# Patient Record
Sex: Male | Born: 1950 | Race: Black or African American | Hispanic: No | Marital: Married | Smoking: Current every day smoker
Health system: Southern US, Community
[De-identification: ages and names within clinical notes are randomized; demographics above are authoritative.]

## PROBLEM LIST (undated history)

## (undated) HISTORY — PX: HERNIA REPAIR: SHX51

---

## 2006-10-15 ENCOUNTER — Ambulatory Visit: Payer: Self-pay | Admitting: Gastroenterology

## 2006-10-23 ENCOUNTER — Ambulatory Visit: Payer: Self-pay | Admitting: Gastroenterology

## 2006-10-23 ENCOUNTER — Encounter (INDEPENDENT_AMBULATORY_CARE_PROVIDER_SITE_OTHER): Payer: Self-pay | Admitting: Specialist

## 2009-09-25 ENCOUNTER — Encounter (INDEPENDENT_AMBULATORY_CARE_PROVIDER_SITE_OTHER): Payer: Self-pay | Admitting: *Deleted

## 2010-07-09 ENCOUNTER — Telehealth: Payer: Self-pay | Admitting: Gastroenterology

## 2010-07-10 ENCOUNTER — Encounter
Admission: RE | Admit: 2010-07-10 | Discharge: 2010-07-10 | Payer: Self-pay | Source: Home / Self Care | Attending: Internal Medicine | Admitting: Internal Medicine

## 2010-07-23 ENCOUNTER — Encounter
Admission: RE | Admit: 2010-07-23 | Discharge: 2010-07-23 | Payer: Self-pay | Source: Home / Self Care | Attending: Internal Medicine | Admitting: Internal Medicine

## 2010-07-23 ENCOUNTER — Other Ambulatory Visit: Payer: Self-pay | Admitting: Interventional Radiology

## 2010-07-23 ENCOUNTER — Other Ambulatory Visit
Admission: RE | Admit: 2010-07-23 | Discharge: 2010-07-23 | Payer: Self-pay | Source: Home / Self Care | Admitting: Interventional Radiology

## 2010-08-08 NOTE — Progress Notes (Signed)
Summary: Schedule Colonoscopy   Phone Note Outgoing Call Call back at Home Phone [phone]   Call placed by: Harlow Mares CMA Duncan Dull),  July 09, 2010 12:21 PM Call placed to: Patient Summary of Call: patient is due for colonoscopy, i attempted to contact the patient and his number just stops ringing.  Initial call taken by: Harlow Mares CMA Duncan Dull),  July 09, 2010 12:22 PM  Follow-up for Phone Call        patients numbers are not working we will mail a letter to remind the patient of his recall  Follow-up by: Harlow Mares CMA (AAMA),  July 19, 2010 1:51 PM

## 2010-08-08 NOTE — Letter (Signed)
Summary: Colonoscopy Letter  [facility]  [address] [place], [place] [address]   Phone: [phone]  Fax: [phone]      September 25, 2009 MRN: [id number]   [facility] [address] [place], [place]  [address]   Dear Mr. Easterday,   According to your medical record, it is time for you to schedule a Colonoscopy. The American Cancer Society recommends this procedure as a method to detect early colon cancer. Patients with a family history of colon cancer, or a personal history of colon polyps or inflammatory bowel disease are at increased risk.  This letter has been generated based on the recommendations made at the time of your procedure. If you feel that in your particular situation this may no longer apply, please contact our office.  Please call our office at [phone] to schedule this appointment or to update your records at your earliest convenience.  Thank you for cooperating with [place] to provide you with the very best care possible.   Sincerely,  Barbette Hair. Arlyce Dice, M.D.  [facility] [phone]

## 2010-09-16 ENCOUNTER — Ambulatory Visit (HOSPITAL_COMMUNITY): Admission: RE | Admit: 2010-09-16 | Payer: 59 | Source: Ambulatory Visit | Admitting: General Surgery

## 2010-09-26 ENCOUNTER — Other Ambulatory Visit (HOSPITAL_COMMUNITY): Payer: 59

## 2010-09-27 ENCOUNTER — Other Ambulatory Visit (HOSPITAL_COMMUNITY): Payer: 59

## 2010-10-09 ENCOUNTER — Ambulatory Visit (HOSPITAL_COMMUNITY): Admission: RE | Admit: 2010-10-09 | Payer: 59 | Source: Ambulatory Visit | Admitting: General Surgery

## 2011-07-06 ENCOUNTER — Encounter (INDEPENDENT_AMBULATORY_CARE_PROVIDER_SITE_OTHER): Payer: 59

## 2011-07-06 DIAGNOSIS — Z0000 Encounter for general adult medical examination without abnormal findings: Secondary | ICD-10-CM

## 2011-07-06 DIAGNOSIS — E041 Nontoxic single thyroid nodule: Secondary | ICD-10-CM

## 2011-10-09 ENCOUNTER — Encounter: Payer: Self-pay | Admitting: Gastroenterology

## 2017-05-20 ENCOUNTER — Other Ambulatory Visit: Payer: Self-pay

## 2017-05-20 ENCOUNTER — Emergency Department (HOSPITAL_COMMUNITY)
Admission: EM | Admit: 2017-05-20 | Discharge: 2017-05-20 | Disposition: A | Discharge status: Home / Self Care | Payer: 59 | Attending: Emergency Medicine | Admitting: Emergency Medicine

## 2017-05-20 ENCOUNTER — Emergency Department (HOSPITAL_COMMUNITY): Payer: 59

## 2017-05-20 ENCOUNTER — Encounter (HOSPITAL_COMMUNITY): Payer: Self-pay | Admitting: *Deleted

## 2017-05-20 DIAGNOSIS — S72114A Nondisplaced fracture of greater trochanter of right femur, initial encounter for closed fracture: Secondary | ICD-10-CM | POA: Diagnosis not present

## 2017-05-20 DIAGNOSIS — Y929 Unspecified place or not applicable: Secondary | ICD-10-CM | POA: Insufficient documentation

## 2017-05-20 DIAGNOSIS — Y999 Unspecified external cause status: Secondary | ICD-10-CM | POA: Insufficient documentation

## 2017-05-20 DIAGNOSIS — Y9389 Activity, other specified: Secondary | ICD-10-CM | POA: Insufficient documentation

## 2017-05-20 DIAGNOSIS — W01198A Fall on same level from slipping, tripping and stumbling with subsequent striking against other object, initial encounter: Secondary | ICD-10-CM | POA: Diagnosis not present

## 2017-05-20 DIAGNOSIS — F17210 Nicotine dependence, cigarettes, uncomplicated: Secondary | ICD-10-CM | POA: Diagnosis not present

## 2017-05-20 DIAGNOSIS — S79911A Unspecified injury of right hip, initial encounter: Secondary | ICD-10-CM | POA: Diagnosis present

## 2017-05-20 MED ORDER — FENTANYL CITRATE (PF) 100 MCG/2ML IJ SOLN: 50.0000 ug | Freq: Once | INTRAMUSCULAR | Status: DC

## 2017-05-20 MED ORDER — ACETAMINOPHEN 325 MG PO TABS
650.0000 mg | ORAL_TABLET | Freq: Once | ORAL | Status: AC
  Administered 2017-05-20: 650 mg via ORAL
  Filled 2017-05-20: qty 2

## 2017-05-20 MED ORDER — HYDROCODONE-ACETAMINOPHEN 5-325 MG PO TABS: 1.0000 | ORAL_TABLET | Freq: Four times a day (QID) | ORAL | 0 refills | Status: DC | PRN

## 2017-05-20 NOTE — ED Notes (Signed)
Patient transported to CT 

## 2017-05-20 NOTE — ED Triage Notes (Signed)
Pt states he slipped on water yesterday and "pancaked" on his R side.  States unable to bear weight to R foot since then.

## 2017-05-20 NOTE — Discharge Instructions (Signed)
Norco and motrin as needed for pain. Ice affected area (see instructions below).  Use the walker for toe-touch weightbearing as tolerated. Please call the orthopedic physician listed today or first thing in the morning to schedule a follow up appointment.   Fractures generally take 4-6 weeks to heal. It is very important to keep your splint dry until your follow up with the orthopedic doctor and a cast can be applied. You may place a plastic bag around the extremity with the splint while bathing to keep it dry. Also try to sleep with the extremity elevated for the next several nights to decrease swelling. Check the fingertips and toes several times per day to make sure they are not cold, pale, or blue. If this is the case, the splint may be too tight and should return to the ER, your regular doctor or the orthopedist for recheck. Return to the ER for new or worsening symptoms, any additional concerns.   COLD THERAPY DIRECTIONS:  Ice or gel packs can be used to reduce both pain and swelling. Ice is the most helpful within the first 24 to 48 hours after an injury or flareup from overusing a muscle or joint.  Ice is effective, has very few side effects, and is safe for most people to use.   If you expose your skin to cold temperatures for too long or without the proper protection, you can damage your skin or nerves. Watch for signs of skin damage due to cold.   HOME CARE INSTRUCTIONS  Follow these tips to use ice and cold packs safely.  Place a dry or damp towel between the ice and skin. A damp towel will cool the skin more quickly, so you may need to shorten the time that the ice is used.  For a more rapid response, add gentle compression to the ice.  Ice for no more than 10 to 20 minutes at a time. The bonier the area you are icing, the less time it will take to get the benefits of ice.  Check your skin after 5 minutes to make sure there are no signs of a poor response to cold or skin damage.  Rest 20  minutes or more in between uses.  Once your skin is numb, you can end your treatment. You can test numbness by very lightly touching your skin. The touch should be so light that you do not see the skin dimple from the pressure of your fingertip. When using ice, most people will feel these normal sensations in this order: cold, burning, aching, and numbness.

## 2017-05-20 NOTE — Discharge Planning (Signed)
Nicholas Cohnamellia Martin Belling, RN, BSN, [place] [phone] Pt qualifies for DME rolling walker.  DME  ordered through [facility].  [facility] notified to deliver rolling walker to pt room prior to D/C home.

## 2017-05-20 NOTE — ED Provider Notes (Signed)
[facility] EMERGENCY DEPARTMENT Provider Note   CSN: [id number] Arrival date & time: 05/20/17  0848     History   Chief Complaint Chief Complaint  Patient presents with  . Hip Pain    HPI Nicholas Montes is a 66 y.o. male.  HPI 66 year old African-American male with no pertinent past medical history presents to the ED with complaints of right hip pain.  Patient states that yesterday he slipped on water and fell onto his right hip.  Patient states that he is able to bear weight on the right leg since the incident.  Patient states that he did continue to work after he fell yesterday but last night the pain worsen.  Patient denies any associated edema, paresthesias, weakness.  Patient denies any associated wound injury or LOC.  Patient has not taking for the pain prior to arrival.  Ambulation, range of motion and palpation make the pain worse.  Holding still and sitting makes the pain better. History reviewed. No pertinent past medical history.  There are no active problems to display for this patient.   Past Surgical History:  Procedure Laterality Date  . HERNIA REPAIR         Home Medications    Prior to Admission medications   Not on File    Family History No family history on file.  Social History Social History   Tobacco Use  . Smoking status: Current Every Day Smoker    Packs/day: 0.50    Types: Cigarettes  . Smokeless tobacco: Never Used  Substance Use Topics  . Alcohol use: No    Frequency: Never  . Drug use: No     Allergies   Patient has no known allergies.   Review of Systems Review of Systems  Musculoskeletal: Positive for arthralgias, gait problem and joint swelling. Negative for back pain, myalgias, neck pain and neck stiffness.  Skin: Negative for color change and wound.  Neurological: Negative for syncope, weakness, numbness and headaches.     Physical Exam Updated Vital Signs BP (!) 134/96   Pulse 65   Temp  98.6 F (37 C) (Oral)   Resp 18   Ht 5\' 11"  (1.803 m)   Wt 64.9 kg (143 lb)   SpO2 96%   BMI 19.94 kg/m   Physical Exam  Constitutional: He appears well-developed and well-nourished. No distress.  HENT:  Head: Normocephalic and atraumatic.  Eyes: Right eye exhibits no discharge. Left eye exhibits no discharge. No scleral icterus.  Neck: Normal range of motion.  Cardiovascular: Intact distal pulses.  Pulmonary/Chest: No respiratory distress.  Musculoskeletal: Normal range of motion.  Patient with palpation over the right SI joint.  No obvious deformity, ecchymosis, edema.  Patient has limited active range of motion with flexion of the right hip.  He does have pain with passive flexion of the hip and external and internal rotation.  Patient has no shortening or external rotation of the right lower extremity.  DP pulses are 2+ bilaterally.  Sensation intact.  Full range of motion of the right knee without any pain.  Full range of motion of the right ankle without any pain.  Brisk cap refill.  Strength 5 out of 5 in lower extremities.  Patient has no midline L-spine or T-spine tenderness.  No deformity or step-offs noted.  No paraspinal tenderness.  Pelvis is stable.  Patient has pain with significant pressure of the right extremity.  Neurological: He is alert.  Skin: Skin is warm and dry.  Capillary refill takes less than 2 seconds. No pallor.  Psychiatric: His behavior is normal. Judgment and thought content normal.  Nursing note and vitals reviewed.    ED Treatments / Results  Labs (all labs ordered are listed, but only abnormal results are displayed) Labs Reviewed - No data to display  EKG  EKG Interpretation None       Radiology Ct Pelvis Wo Contrast  Result Date: 05/20/2017 CLINICAL DATA:  Right hip pain and limited range of motion after a fall yesterday. EXAM: CT PELVIS WITHOUT CONTRAST TECHNIQUE: Multidetector CT imaging of the pelvis was performed following the  standard protocol without intravenous contrast. COMPARISON:  Radiographs dated 05/20/2017 FINDINGS: Musculoskeletal: There is a hairline longitudinal fracture of the right greater trochanter with no displacement. No other acute abnormality. Chronic fusion of the sacroiliac joints. Minimal marginal osteophytes on both femoral heads. Urinary Tract:  No abnormality visualized. Bowel:  Unremarkable visualized pelvic bowel loops. Vascular/Lymphatic: Aortic atherosclerosis.  No adenopathy. Reproductive:  Prostate gland is normal. Other:  None IMPRESSION: Hairline nondisplaced fracture of the right greater trochanter. Electronically Signed   By: Francene BoyersJames  Maxwell M.D.   On: 05/20/2017 12:21   Dg Hip Unilat  With Pelvis 2-3 Views Right  Result Date: 05/20/2017 CLINICAL DATA:  Slip and fall with pain, initial encounter EXAM: DG HIP (WITH OR WITHOUT PELVIS) 2-3V RIGHT COMPARISON:  None. FINDINGS: The pelvic ring is intact. No acute fracture or dislocation is noted. No soft tissue changes are seen. IMPRESSION: No acute abnormality noted. Electronically Signed   By: Alcide CleverMark  Lukens M.D.   On: 05/20/2017 09:45    Procedures Procedures (including critical care time)  Medications Ordered in ED Medications  fentaNYL (SUBLIMAZE) injection 50 mcg (not administered)  acetaminophen (TYLENOL) tablet 650 mg (650 mg Oral Given 05/20/17 1116)     Initial Impression / Assessment and Plan / ED Course  I have reviewed the triage vital signs and the nursing notes.  Pertinent labs & imaging results that were available during my care of the patient were reviewed by me and considered in my medical decision making (see chart for details).     Patient presents to the ED with complaints of right hip pain.  Patient fell yesterday after mechanical fall.  Denies head injury or LOC.  On exam patient is neurovascularly intact.  Limited range of motion due to pain.  No obvious deformity, ecchymosis noted.  X-ray of the right hip was  unremarkable.  CT scan was obtained given patient's pain with weightbearing of the right leg.  CT scan does show a hairline fracture of the greater trochanter.  Patient remains neurovascularly intact.  Pain is been managed in the ED.  Patient denies needing any further pain management at this time.  Spoke with Dr. [facility]  He recommends patient having a walker with toe-touch and pain medicine.  Will see patient in the clinic in 1 week.  Pt is hemodynamically stable, in NAD, & able to ambulate in the ED. Evaluation does not show pathology that would require ongoing emergent intervention or inpatient treatment. I explained the diagnosis to the patient. Pain has been managed & has no complaints prior to dc. Pt is comfortable with above plan and is stable for discharge at this time. All questions were answered prior to disposition. Strict return precautions for f/u to the ED were discussed. Encouraged follow up with PCP.  Pt dicussed and seen by Dr. Adriana Simasook who is agreeable with the  above plan.  Final Clinical Impressions(s) / ED Diagnoses   Final diagnoses:  Nondisplaced fracture of greater trochanter of right femur, initial encounter for closed fracture [facility](HCC)    ED Discharge Orders        Ordered    HYDROcodone-acetaminophen (NORCO) 5-325 MG tablet  Every 6 hours PRN     05/20/17 1315       Wallace KellerLeaphart, Anajulia Leyendecker T, PA-C 05/20/17 1316    Donnetta Hutchingook, Brian, MD 05/26/17 1024

## 2017-05-20 NOTE — Progress Notes (Signed)
CSW acknowledges consult. Please consult RN CM for equipment needs. There are no further CSW needs at this time. CSW signing goff.     Claude MangesKierra S. Cartez Mogle, MSW, LCSW-A Emergency Department Clinical Social Worker [phone]

## 2018-05-08 IMAGING — DX DG HIP (WITH OR WITHOUT PELVIS) 2-3V*R*
3 series · 3 of 3 positions shown · non-contrast
Comparison: None.

CLINICAL DATA: Slip and fall with pain, initial encounter

EXAM:
DG HIP (WITH OR WITHOUT PELVIS) 2-3V RIGHT

[pelvis ap]
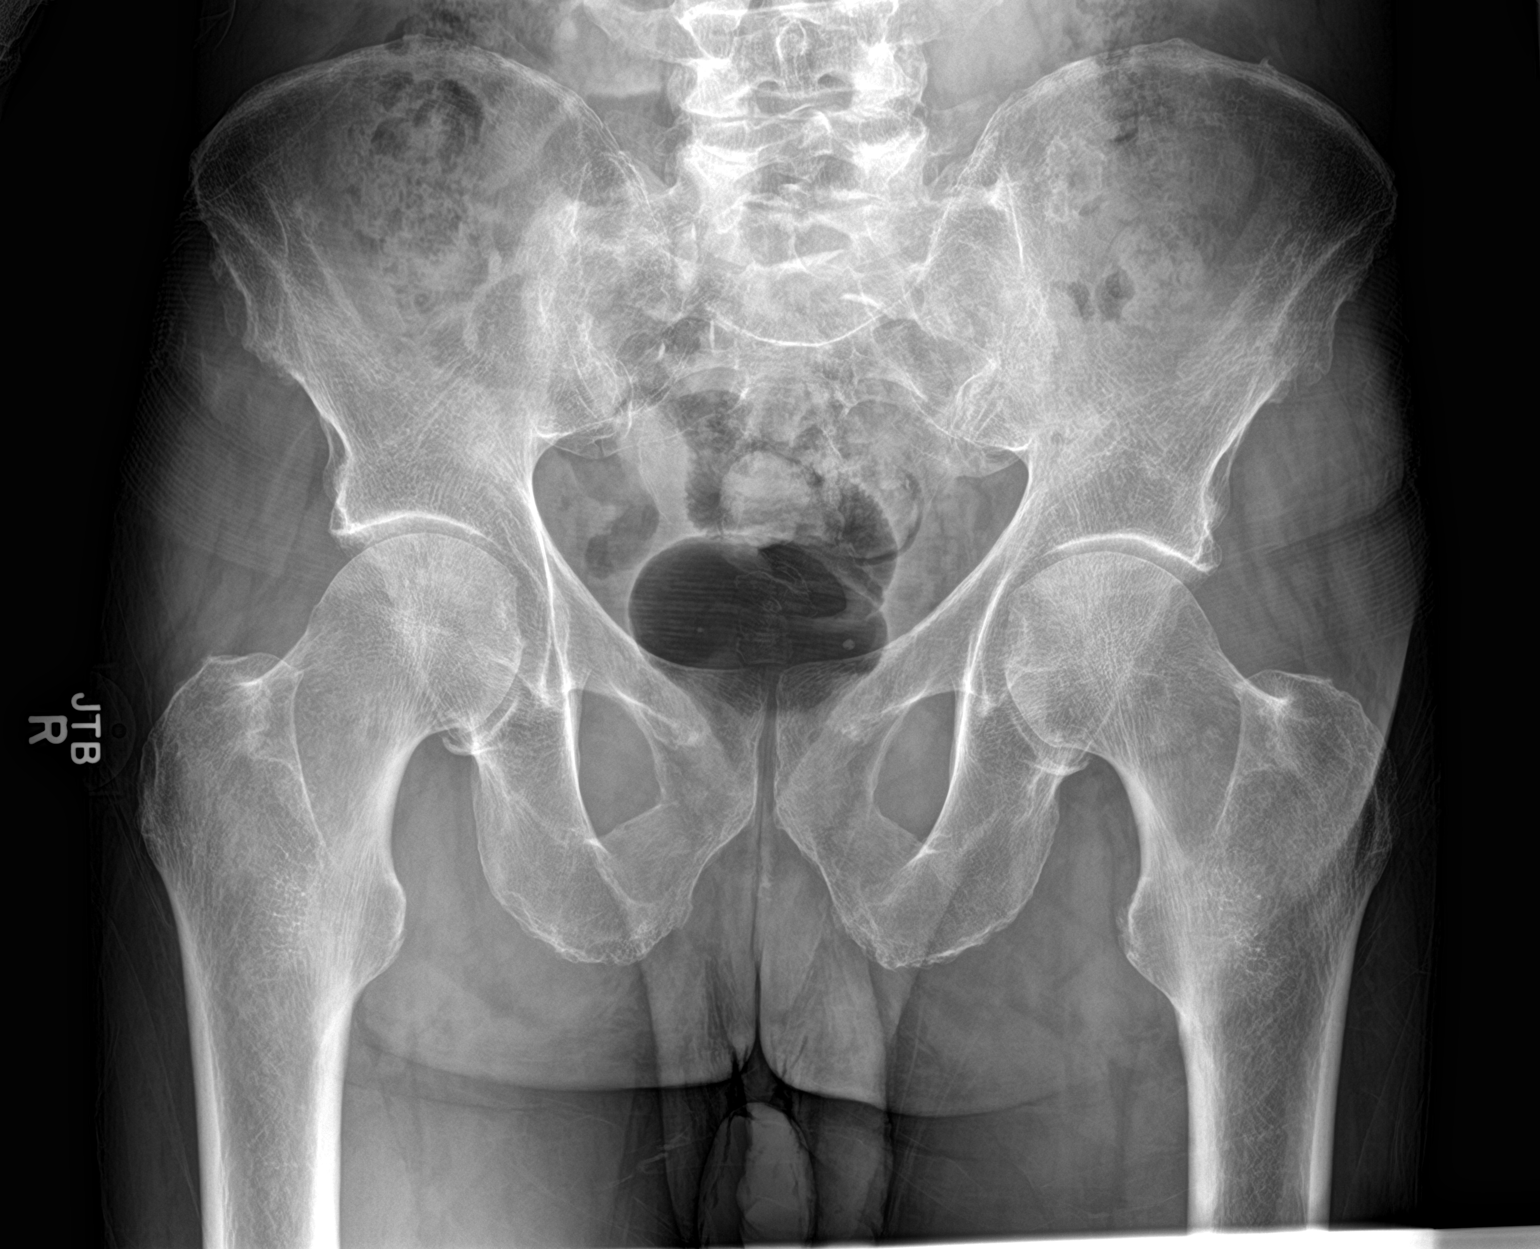

[hip ap]
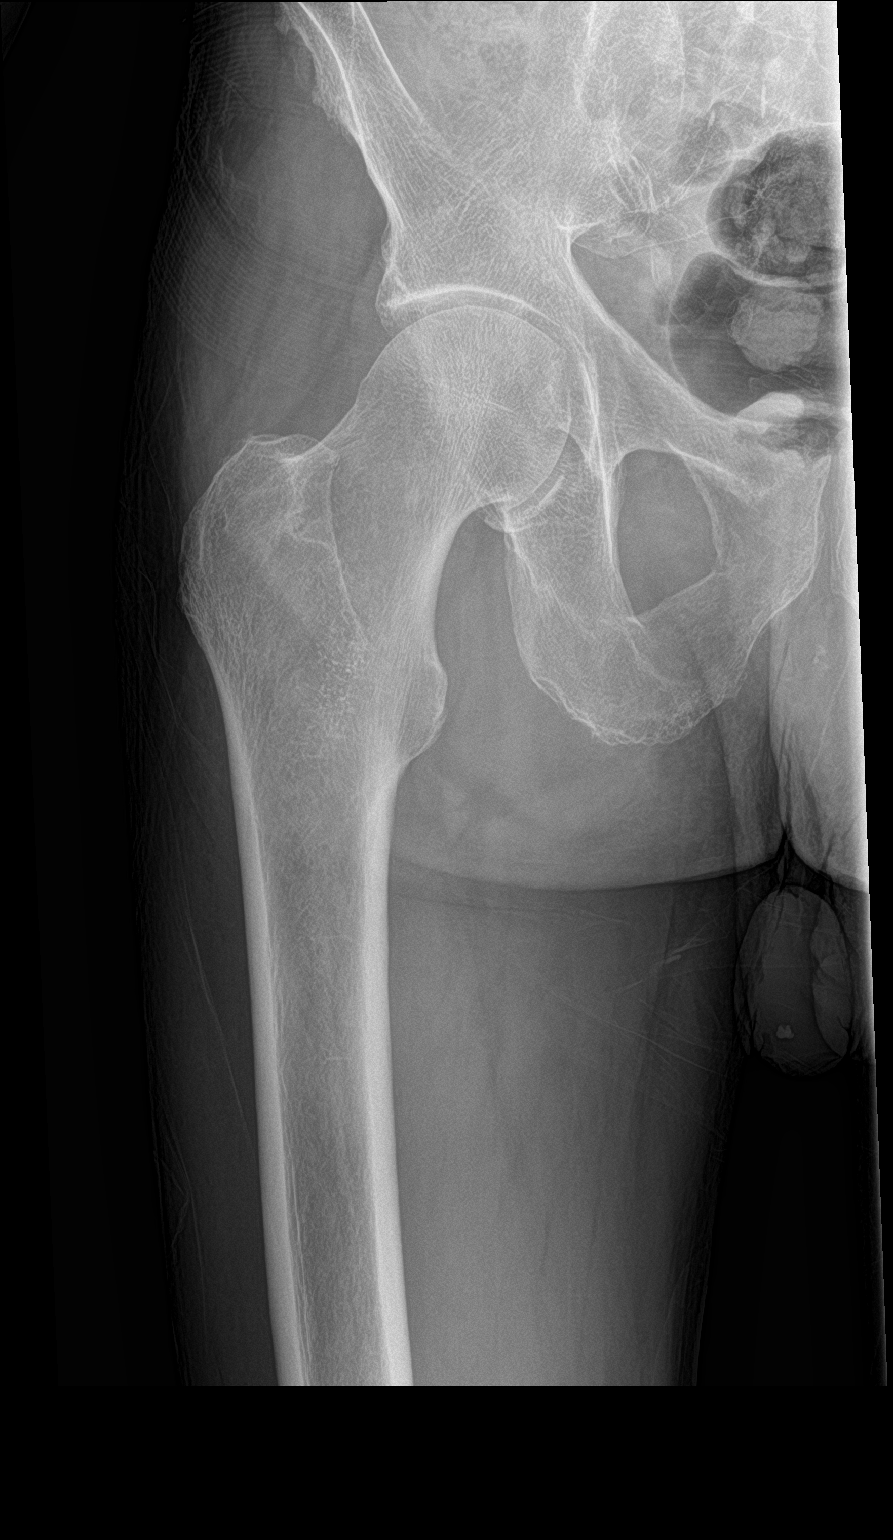

[hip lat]
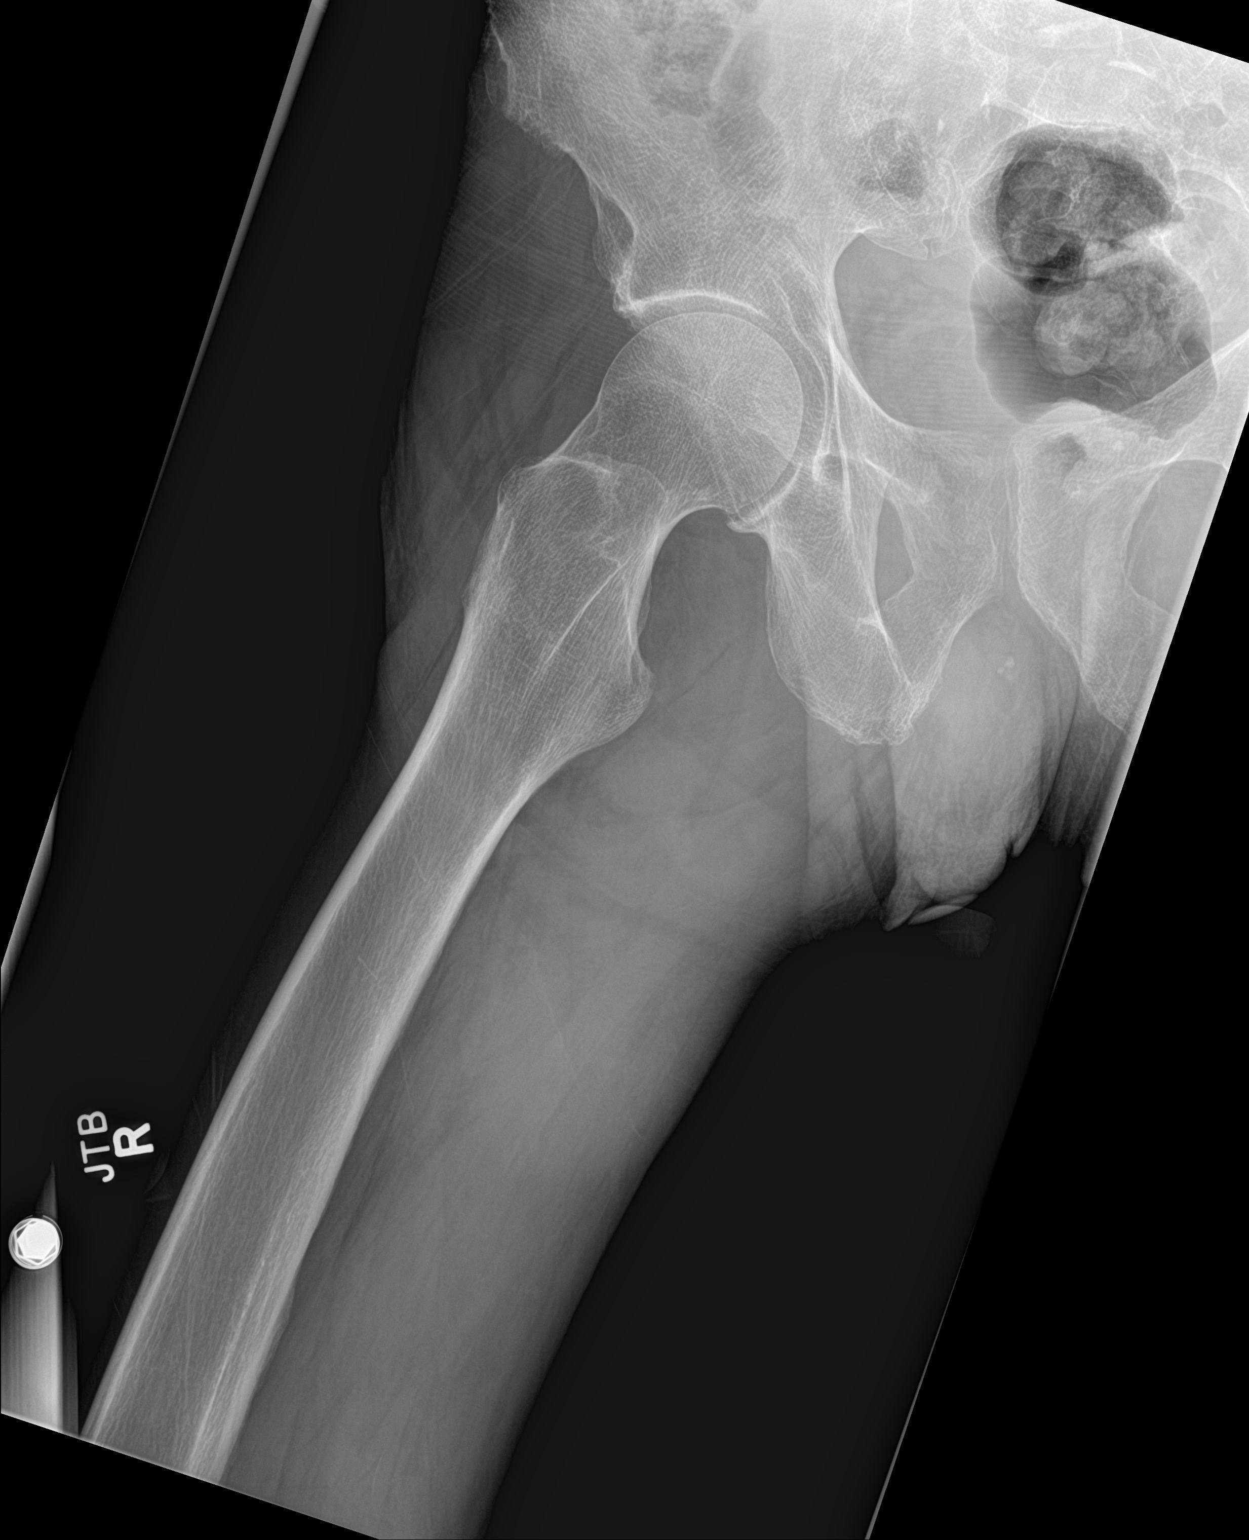

[3 of 3 positions shown; findings below may reference images not displayed]

FINDINGS: The pelvic ring is intact. No acute fracture or dislocation is
noted. No soft tissue changes are seen.
IMPRESSION: No acute abnormality noted.

## 2018-05-08 IMAGING — CT CT PELVIS W/O CM
1 series · 1 of 1 positions shown · non-contrast
Comparison: Radiographs dated 05/20/2017

CLINICAL DATA: Right hip pain and limited range of motion after a
fall yesterday.

EXAM:
CT PELVIS WITHOUT CONTRAST
TECHNIQUE: Multidetector CT imaging of the pelvis was performed following the
standard protocol without intravenous contrast.

[Series 2: topogram 0.6 t80f · sagittal · 1.00mm/px · 1 of 1 slices shown]
[im 1/1]
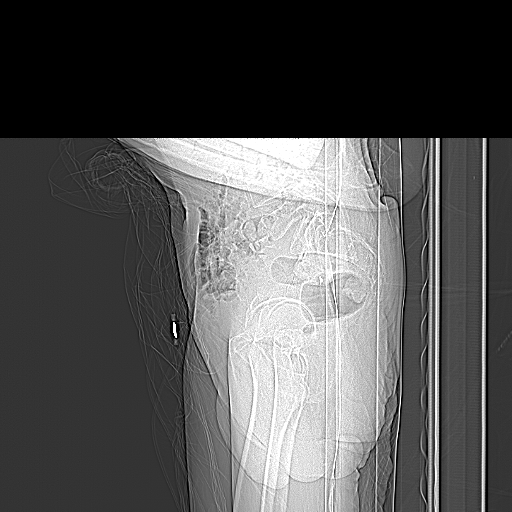

[1 of 1 positions shown; findings below may reference images not displayed]

FINDINGS: Musculoskeletal: There is a hairline longitudinal fracture of the
right greater trochanter with no displacement. No other acute
abnormality. Chronic fusion of the sacroiliac joints.

Minimal marginal osteophytes on both femoral heads.

Urinary Tract:  No abnormality visualized.

Bowel:  Unremarkable visualized pelvic bowel loops.

Vascular/Lymphatic: Aortic atherosclerosis.  No adenopathy.

Reproductive:  Prostate gland is normal.

Other:  None
IMPRESSION: Hairline nondisplaced fracture of the right greater trochanter.

## 2019-07-21 ENCOUNTER — Other Ambulatory Visit: Payer: Self-pay | Admitting: Cardiology

## 2019-07-21 DIAGNOSIS — Z20822 Contact with and (suspected) exposure to covid-19: Secondary | ICD-10-CM

## 2019-07-22 LAB — NOVEL CORONAVIRUS, NAA: SARS-CoV-2, NAA: DETECTED — AB

## 2023-08-25 ENCOUNTER — Encounter (HOSPITAL_COMMUNITY): Payer: Self-pay

## 2023-08-25 ENCOUNTER — Ambulatory Visit (HOSPITAL_COMMUNITY)
Admission: EM | Admit: 2023-08-25 | Discharge: 2023-08-25 | Disposition: A | Discharge status: Home / Self Care | Payer: Medicare Other | Attending: Emergency Medicine | Admitting: Emergency Medicine

## 2023-08-25 DIAGNOSIS — K5900 Constipation, unspecified: Secondary | ICD-10-CM

## 2023-08-25 DIAGNOSIS — K648 Other hemorrhoids: Secondary | ICD-10-CM | POA: Diagnosis not present

## 2023-08-25 MED ORDER — PLECANATIDE 3 MG PO TABS: 1.0000 | ORAL_TABLET | Freq: Every day | ORAL | 0 refills | Status: AC

## 2023-08-25 MED ORDER — HYDROCORTISONE (PERIANAL) 2.5 % EX CREA: 1.0000 | TOPICAL_CREAM | Freq: Two times a day (BID) | CUTANEOUS | 0 refills | Status: AC

## 2023-08-25 NOTE — ED Triage Notes (Signed)
 Pt c/o constipation x2 days d/t the pain from his hemorrhoid. States painful to sit.

## 2023-08-25 NOTE — Discharge Instructions (Signed)
 For relief of chronic constipation, please begin plecanatide tablets, 1 tablet daily as needed to move your bowels.  For relief of internal hemorrhoids, please apply hydrocortisone rectal cream twice daily for 7 days.  After 7 days, if you have not had meaningful improvement of your symptoms, please follow-up with your primary care provider.  Thank you for visiting [facility] today.

## 2023-08-25 NOTE — ED Provider Notes (Signed)
 [facility]    CSN: [id number] Arrival date & time: 08/25/23  1140    HISTORY   Chief Complaint  Patient presents with   Hemorrhoids   Constipation   HPI Nicholas Montes is a pleasant, 73 y.o. male who presents to urgent care today. Patient reports history of constant constipation, worse over the past 2 days.  Patient states he believes that he has developed a hemorrhoid but states the hemorrhoid is not sticking out.  Patient states it is painful to sit down.  Patient denies rectal bleeding.  Patient states has never had hemorrhoids before.  The history is provided by the patient.   History reviewed. No pertinent past medical history. There are no active problems to display for this patient.  Past Surgical History:  Procedure Laterality Date   HERNIA REPAIR      Home Medications    Prior to Admission medications   Not on File    Family History History reviewed. No pertinent family history. Social History Social History   Tobacco Use   Smoking status: Every Day    Current packs/day: 0.50    Types: Cigarettes   Smokeless tobacco: Never  Vaping Use   Vaping status: Never Used  Substance Use Topics   Alcohol use: No   Drug use: No   Allergies   Patient has no known allergies.  Review of Systems Review of Systems Pertinent findings revealed after performing a 14 point review of systems has been noted in the history of present illness.  Physical Exam Vital Signs BP (!) 188/105 (BP Location: Right Arm)   Pulse 90   Temp 98.3 F (36.8 C) (Oral)   Resp 20   SpO2 97%   No data found.  Physical Exam Vitals and nursing note reviewed.  Constitutional:      General: He is not in acute distress.    Appearance: Normal appearance. He is normal weight. He is not ill-appearing.  HENT:     Head: Normocephalic and atraumatic.  Eyes:     Extraocular Movements: Extraocular movements intact.     Conjunctiva/sclera: Conjunctivae normal.     Pupils:  Pupils are equal, round, and reactive to light.  Cardiovascular:     Rate and Rhythm: Normal rate and regular rhythm.  Pulmonary:     Effort: Pulmonary effort is normal.     Breath sounds: Normal breath sounds.  Genitourinary:    Comments: Pt politely declined rectal examination Musculoskeletal:        General: Normal range of motion.     Cervical back: Normal range of motion and neck supple.  Skin:    General: Skin is warm and dry.  Neurological:     General: No focal deficit present.     Mental Status: He is alert and oriented to person, place, and time. Mental status is at baseline.  Psychiatric:        Mood and Affect: Mood normal.        Behavior: Behavior normal.        Thought Content: Thought content normal.        Judgment: Judgment normal.     Visual Acuity Right Eye Distance:   Left Eye Distance:   Bilateral Distance:    Right Eye Near:   Left Eye Near:    Bilateral Near:     UC Couse / Diagnostics / Procedures:     Radiology No results found.  Procedures Procedures (including critical care time) EKG  Pending results:  Labs Reviewed - No data to display  Medications Ordered in UC: Medications - No data to display  UC Diagnoses / Final Clinical Impressions(s)   I have reviewed the triage vital signs and the nursing notes.  Pertinent labs & imaging results that were available during my care of the patient were reviewed by me and considered in my medical decision making (see chart for details).    Final diagnoses:  Constipation, unspecified constipation type  Internal hemorrhoids   Patient provided with hydrocortisone rectal cream for relief of presumed internal hemorrhoids.  Have provided patient with Trulance for relief of constipation.  Follow-up with PCP as needed.  Please see discharge instructions below for details of plan of care as provided to patient. ED Prescriptions     Medication Sig Dispense Auth. Provider   hydrocortisone  (ANUSOL-HC) 2.5 % rectal cream Place 1 Application rectally 2 (two) times daily. Discontinue after 1 week 30 g Theadora Rama Scales, PA-C   Plecanatide 3 MG TABS Take 1 tablet (3 mg total) by mouth daily. 30 tablet Theadora Rama Scales, PA-C      PDMP not reviewed this encounter.  Pending results:  Labs Reviewed - No data to display    Discharge Instructions      For relief of chronic constipation, please begin plecanatide tablets, 1 tablet daily as needed to move your bowels.  For relief of internal hemorrhoids, please apply hydrocortisone rectal cream twice daily for 7 days.  After 7 days, if you have not had meaningful improvement of your symptoms, please follow-up with your primary care provider.  Thank you for visiting [facility] today.      Disposition Upon Discharge:  Condition: stable for discharge home  Patient presented with an acute illness with associated systemic symptoms and significant discomfort requiring urgent management. In my opinion, this is a condition that a prudent lay person (someone who possesses an average knowledge of health and medicine) may potentially expect to result in complications if not addressed urgently such as respiratory distress, impairment of bodily function or dysfunction of bodily organs.   Routine symptom specific, illness specific and/or disease specific instructions were discussed with the patient and/or caregiver at length.   As such, the patient has been evaluated and assessed, work-up was performed and treatment was provided in alignment with urgent care protocols and evidence based medicine.  Patient/parent/caregiver has been advised that the patient may require follow up for further testing and treatment if the symptoms continue in spite of treatment, as clinically indicated and appropriate.  Patient/parent/caregiver has been advised to return to the [facility] or PCP if no better; to PCP or the Emergency Department if  new signs and symptoms develop, or if the current signs or symptoms continue to change or worsen for further workup, evaluation and treatment as clinically indicated and appropriate  The patient will follow up with their current PCP if and as advised. If the patient does not currently have a PCP we will assist them in obtaining one.   The patient may need specialty follow up if the symptoms continue, in spite of conservative treatment and management, for further workup, evaluation, consultation and treatment as clinically indicated and appropriate.  Patient/parent/caregiver verbalized understanding and agreement of plan as discussed.  All questions were addressed during visit.  Please see discharge instructions below for further details of plan.  This office note has been dictated using Teaching laboratory technician.  Unfortunately, this method of dictation can sometimes lead to  typographical or grammatical errors.  I apologize for your inconvenience in advance if this occurs.  Please do not hesitate to reach out to me if clarification is needed.      Theadora Rama Scales, PA-C 08/25/23 1408

## 2023-10-09 ENCOUNTER — Encounter: Payer: Self-pay | Admitting: Podiatry

## 2023-10-09 ENCOUNTER — Ambulatory Visit: Admitting: Podiatry

## 2023-10-09 DIAGNOSIS — M79675 Pain in left toe(s): Secondary | ICD-10-CM | POA: Diagnosis not present

## 2023-10-09 DIAGNOSIS — M79674 Pain in right toe(s): Secondary | ICD-10-CM | POA: Diagnosis not present

## 2023-10-09 DIAGNOSIS — B351 Tinea unguium: Secondary | ICD-10-CM | POA: Diagnosis not present

## 2023-10-09 NOTE — Progress Notes (Signed)
  Subjective:  Patient ID: Nicholas Montes, male    DOB: 1951/03/12,  MRN: [id number]  Chief Complaint  Patient presents with   Debridement    Requesting toenail trim - very long and thick toenails, would like to be set up to see doc to trim them every so often   New Patient (Initial Visit)   73 y.o. male returns for the above complaint.  Patient presents with thickened onychodystrophy mycotic toenails x 10 mild pain on palpation hurts with ambulation as pressure patient would like to have a debride down he is unable to do it himself  Objective:  There were no vitals filed for this visit. Podiatric Exam: Vascular: dorsalis pedis and posterior tibial pulses are palpable bilateral. Capillary return is immediate. Temperature gradient is WNL. Skin turgor WNL  Sensorium: Normal Semmes Weinstein monofilament test. Normal tactile sensation bilaterally. Nail Exam: Pt has thick disfigured discolored nails with subungual debris noted bilateral entire nail hallux through fifth toenails.  Pain on palpation to the nails. Ulcer Exam: There is no evidence of ulcer or pre-ulcerative changes or infection. Orthopedic Exam: Muscle tone and strength are WNL. No limitations in general ROM. No crepitus or effusions noted.  Skin: No Porokeratosis. No infection or ulcers    Assessment & Plan:  No diagnosis found.  Patient was evaluated and treated and all questions answered.  Onychomycosis with pain  -Nails palliatively debrided as below. -Educated on self-care  Procedure: Nail Debridement Rationale: pain  Type of Debridement: manual, sharp debridement. Instrumentation: Nail nipper, rotary burr. Number of Nails: 10  Procedures and Treatment: Consent by patient was obtained for treatment procedures. The patient understood the discussion of treatment and procedures well. All questions were answered thoroughly reviewed. Debridement of mycotic and hypertrophic toenails, 1 through 5 bilateral and clearing of  subungual debris. No ulceration, no infection noted.  Return Visit-Office Procedure: Patient instructed to return to the office for a follow up visit 3 months for continued evaluation and treatment.  Tinnie Forehand, DPM    Return in 3 months (on 01/08/2024) for [facility], Routine Foot Care.

## 2023-11-09 ENCOUNTER — Encounter (HOSPITAL_COMMUNITY): Payer: Self-pay | Admitting: Emergency Medicine

## 2023-11-09 ENCOUNTER — Emergency Department (HOSPITAL_COMMUNITY)

## 2023-11-09 ENCOUNTER — Other Ambulatory Visit: Payer: Self-pay

## 2023-11-09 ENCOUNTER — Emergency Department (HOSPITAL_BASED_OUTPATIENT_CLINIC_OR_DEPARTMENT_OTHER)

## 2023-11-09 ENCOUNTER — Emergency Department (HOSPITAL_COMMUNITY)
Admission: EM | Admit: 2023-11-09 | Discharge: 2023-11-09 | Disposition: A | Discharge status: Home / Self Care | Attending: Emergency Medicine | Admitting: Emergency Medicine

## 2023-11-09 DIAGNOSIS — R9431 Abnormal electrocardiogram [ECG] [EKG]: Secondary | ICD-10-CM

## 2023-11-09 DIAGNOSIS — E042 Nontoxic multinodular goiter: Secondary | ICD-10-CM | POA: Diagnosis not present

## 2023-11-09 DIAGNOSIS — R008 Other abnormalities of heart beat: Secondary | ICD-10-CM | POA: Diagnosis not present

## 2023-11-09 DIAGNOSIS — R931 Abnormal findings on diagnostic imaging of heart and coronary circulation: Secondary | ICD-10-CM | POA: Insufficient documentation

## 2023-11-09 DIAGNOSIS — K7689 Other specified diseases of liver: Secondary | ICD-10-CM | POA: Diagnosis not present

## 2023-11-09 LAB — BASIC METABOLIC PANEL WITH GFR
Anion gap: 10 (ref 5–15)
BUN: 7 mg/dL — ABNORMAL LOW (ref 8–23)
CO2: 24 mmol/L (ref 22–32)
Calcium: 9.4 mg/dL (ref 8.9–10.3)
Chloride: 105 mmol/L (ref 98–111)
Creatinine, Ser: 1.06 mg/dL (ref 0.61–1.24)
GFR, Estimated: >60 mL/min (ref >60)
Glucose, Bld: 103 mg/dL — ABNORMAL HIGH (ref 70–99)
Potassium: 3.8 mmol/L (ref 3.5–5.1)
Sodium: 139 mmol/L (ref 135–145)

## 2023-11-09 LAB — CBC WITH DIFFERENTIAL/PLATELET
Abs Immature Granulocytes: 0.01 10*3/uL (ref 0.00–0.07)
Basophils Absolute: 0.1 10*3/uL (ref 0.0–0.1)
Basophils Relative: 1 %
Eosinophils Absolute: 0.7 10*3/uL — ABNORMAL HIGH (ref 0.0–0.5)
Eosinophils Relative: 10 %
HCT: 37.5 % — ABNORMAL LOW (ref 39.0–52.0)
Hemoglobin: 12 g/dL — ABNORMAL LOW (ref 13.0–17.0)
Immature Granulocytes: 0 %
Lymphocytes Relative: 22 %
Lymphs Abs: 1.5 10*3/uL (ref 0.7–4.0)
MCH: 28 pg (ref 26.0–34.0)
MCHC: 32 g/dL (ref 30.0–36.0)
MCV: 87.6 fL (ref 80.0–100.0)
Monocytes Absolute: 0.5 10*3/uL (ref 0.1–1.0)
Monocytes Relative: 7 %
Neutro Abs: 4 10*3/uL (ref 1.7–7.7)
Neutrophils Relative %: 60 %
Platelets: 339 10*3/uL (ref 150–400)
RBC: 4.28 MIL/uL (ref 4.22–5.81)
RDW: 15.6 % — ABNORMAL HIGH (ref 11.5–15.5)
WBC: 6.8 10*3/uL (ref 4.0–10.5)
nRBC: 0 % (ref 0.0–0.2)

## 2023-11-09 LAB — I-STAT CHEM 8, ED
BUN: 8 mg/dL (ref 8–23)
Calcium, Ion: 1.19 mmol/L (ref 1.15–1.40)
Chloride: 104 mmol/L (ref 98–111)
Creatinine, Ser: 1.1 mg/dL (ref 0.61–1.24)
Glucose, Bld: 102 mg/dL — ABNORMAL HIGH (ref 70–99)
HCT: 38 % — ABNORMAL LOW (ref 39.0–52.0)
Hemoglobin: 12.9 g/dL — ABNORMAL LOW (ref 13.0–17.0)
Potassium: 3.9 mmol/L (ref 3.5–5.1)
Sodium: 141 mmol/L (ref 135–145)
TCO2: 25 mmol/L (ref 22–32)

## 2023-11-09 LAB — ECHOCARDIOGRAM COMPLETE
Area-P 1/2: 2.62 cm2
S' Lateral: 2.8 cm

## 2023-11-09 LAB — TROPONIN I (HIGH SENSITIVITY)
Troponin I (High Sensitivity): 5 ng/L (ref <18)
Troponin I (High Sensitivity): 6 ng/L (ref <18)

## 2023-11-09 MED ORDER — IOHEXOL 350 MG/ML SOLN
75.0000 mL | Freq: Once | INTRAVENOUS | Status: AC | PRN
  Administered 2023-11-09: 75 mL via INTRAVENOUS

## 2023-11-09 NOTE — ED Provider Notes (Addendum)
 Screen patient.  Sent in with reported abnormal echo.  Occasional cough and otherwise feeling fine.  Does not know if he had an echo before.  However I do not have access to the echocardiogram.  Will discuss with cardiology but likely will get echocardiogram done here.   Mozell Arias, MD 11/09/23 1433     Mozell Arias, MD 11/09/23 1433  Discussed with Trish from cardiology and echocardiogram ordered here.  Will be done stat.    Mozell Arias, MD 11/09/23 [phone]

## 2023-11-09 NOTE — ED Provider Notes (Signed)
 [place] [facility] EMERGENCY DEPARTMENT AT [facility] Provider Note   CSN: [id number] Arrival date & time: 11/09/23  1326     History  No chief complaint on file.   Olie Lizak is a 73 y.o. male.  Patient presents for further testing after abnormal echo report outpatient.  Sent by Dr. Minette Amabile for further evaluation imaging.  Patient denies any shortness of breath, chest pain syncope or fevers.  Patient feels well at this time.  No significant cardiac history.  No cardiologist.  Patient thinks he had an echo done for routine screening preventative medicine.  The history is provided by the patient.       Home Medications Prior to Admission medications   Medication Sig Start Date End Date Taking? Authorizing Provider  amLODipine (NORVASC) 5 MG tablet Take 5 mg by mouth daily. 10/01/23   [provider]  hydrocortisone  (ANUSOL -HC) 2.5 % rectal cream Place 1 Application rectally 2 (two) times daily. Discontinue after 1 week 08/25/23   Eloise Hake Scales, PA-C      Allergies    Patient has no known allergies.    Review of Systems   Review of Systems  Constitutional:  Negative for chills and fever.  HENT:  Negative for congestion.   Eyes:  Negative for visual disturbance.  Respiratory:  Negative for shortness of breath.   Cardiovascular:  Negative for chest pain.  Gastrointestinal:  Negative for abdominal pain and vomiting.  Genitourinary:  Negative for dysuria and flank pain.  Musculoskeletal:  Negative for back pain, neck pain and neck stiffness.  Skin:  Negative for rash.  Neurological:  Negative for light-headedness and headaches.    Physical Exam Updated Vital Signs BP 122/83   Pulse 79   Temp 98.1 F (36.7 C)   Resp 18   SpO2 100%  Physical Exam Vitals and nursing note reviewed.  Constitutional:      General: He is not in acute distress.    Appearance: He is well-developed.  HENT:     Head: Normocephalic and atraumatic.     Mouth/Throat:      Mouth: Mucous membranes are moist.  Eyes:     General:        Right eye: No discharge.        Left eye: No discharge.     Conjunctiva/sclera: Conjunctivae normal.  Neck:     Trachea: No tracheal deviation.  Cardiovascular:     Rate and Rhythm: Normal rate and regular rhythm.     Heart sounds: No murmur heard. Pulmonary:     Effort: Pulmonary effort is normal.     Breath sounds: Normal breath sounds.  Abdominal:     General: There is no distension.     Palpations: Abdomen is soft.     Tenderness: There is no abdominal tenderness. There is no guarding.  Musculoskeletal:        General: No swelling or tenderness.     Cervical back: Normal range of motion and neck supple. No rigidity.  Skin:    General: Skin is warm.     Capillary Refill: Capillary refill takes less than 2 seconds.     Findings: No rash.  Neurological:     General: No focal deficit present.     Mental Status: He is alert.     Cranial Nerves: No cranial nerve deficit.  Psychiatric:        Mood and Affect: Mood normal.     ED Results / Procedures / Treatments  Labs (all labs ordered are listed, but only abnormal results are displayed) Labs Reviewed  BASIC METABOLIC PANEL WITH GFR - Abnormal; Notable for the following components:      Result Value   Glucose, Bld 103 (*)    BUN 7 (*)    All other components within normal limits  CBC WITH DIFFERENTIAL/PLATELET - Abnormal; Notable for the following components:   Hemoglobin 12.0 (*)    HCT 37.5 (*)    RDW 15.6 (*)    Eosinophils Absolute 0.7 (*)    All other components within normal limits  I-STAT CHEM 8, ED - Abnormal; Notable for the following components:   Glucose, Bld 102 (*)    Hemoglobin 12.9 (*)    HCT 38.0 (*)    All other components within normal limits  TROPONIN I (HIGH SENSITIVITY)  TROPONIN I (HIGH SENSITIVITY)    EKG EKG Interpretation Date/Time:  Monday Nov 09 2023 13:47:08 EDT Ventricular Rate:  78 PR Interval:  170 QRS  Duration:  80 QT Interval:  360 QTC Calculation: 410 R Axis:   63  Text Interpretation: Normal sinus rhythm Minimal voltage criteria for LVH, may be normal variant ( Sokolow-Lyon ) Borderline ECG No previous ECGs available Confirmed by Mozell Arias [phone]) on 11/09/2023 2:19:51 PM  Radiology ECHOCARDIOGRAM COMPLETE Result Date: 11/09/2023    ECHOCARDIOGRAM REPORT   Patient Name:   EMRAN GRGAS Date of Exam: 11/09/2023 Medical Rec #:  [id number]      Height:       71.0 in Accession #:    [id number]     Weight:       143.0 lb Date of Birth:  [birth year]      BSA:          1.828 m Patient Age:    72 years       BP:           150/101 mmHg Patient Gender: M              HR:           78 bpm. Exam Location:  Inpatient Procedure: 2D Echo, Color Doppler and Cardiac Doppler (Both Spectral and Color            Flow Doppler were utilized during procedure). Indications:    Possible RA Mass  History:        Patient has no prior history of Echocardiogram examinations.  Sonographer:    Sherline Distel Senior RDCS Referring Phys: [phone] NATHAN PICKERING IMPRESSIONS  1. Left ventricular ejection fraction, by estimation, is 60 to 65%. The left ventricle has normal function. The left ventricle has no regional wall motion abnormalities. Left ventricular diastolic parameters were normal.  2. Right ventricular systolic function is normal. The right ventricular size is normal.  3. Left atrial size was mildly dilated.  4. Right atrial size was mildly dilated.  5. The mitral valve is normal in structure. Trivial mitral valve regurgitation. No evidence of mitral stenosis.  6. The aortic valve was not well visualized. Aortic valve regurgitation is not visualized. No aortic stenosis is present.  7. The inferior vena cava is normal in size with greater than 50% respiratory variability, suggesting right atrial pressure of 3 mmHg. Comparison(s): No prior Echocardiogram. Conclusion(s)/Recommendation(s): Technically limited parasternal windows. Very  small filamentous structure seen in RA, best seen on subcostal views, most consistent with Chiari network vs edge of eustachian valve. No high risk findings noted. FINDINGS  Left Ventricle: Left ventricular ejection fraction, by  estimation, is 60 to 65%. The left ventricle has normal function. The left ventricle has no regional wall motion abnormalities. The left ventricular internal cavity size was normal in size. There is  no left ventricular hypertrophy. Left ventricular diastolic parameters were normal. Right Ventricle: The right ventricular size is normal. Right vetricular wall thickness was not well visualized. Right ventricular systolic function is normal. Left Atrium: Left atrial size was mildly dilated. Right Atrium: Right atrial size was mildly dilated. Prominent Chiari network. Pericardium: There is no evidence of pericardial effusion. Mitral Valve: The mitral valve is normal in structure. Trivial mitral valve regurgitation. No evidence of mitral valve stenosis. Tricuspid Valve: The tricuspid valve is normal in structure. Tricuspid valve regurgitation is trivial. No evidence of tricuspid stenosis. Aortic Valve: The aortic valve was not well visualized. Aortic valve regurgitation is not visualized. No aortic stenosis is present. Pulmonic Valve: The pulmonic valve was not well visualized. Pulmonic valve regurgitation is not visualized. No evidence of pulmonic stenosis. Aorta: The aortic root is normal in size and structure and the ascending aorta was not well visualized. Venous: The inferior vena cava is normal in size with greater than 50% respiratory variability, suggesting right atrial pressure of 3 mmHg. IAS/Shunts: The atrial septum is grossly normal.  LEFT VENTRICLE PLAX 2D LVIDd:         4.60 cm   Diastology LVIDs:         2.80 cm   LV e' medial:    8.38 cm/s LV PW:         1.00 cm   LV E/e' medial:  6.3 LV IVS:        0.90 cm   LV e' lateral:   11.70 cm/s LVOT diam:     2.20 cm   LV E/e' lateral:  4.5 LV SV:         82 LV SV Index:   45 LVOT Area:     3.80 cm  RIGHT VENTRICLE RV S prime:     21.60 cm/s TAPSE (M-mode): 3.0 cm LEFT ATRIUM             Index        RIGHT ATRIUM           Index LA diam:        3.50 cm 1.91 cm/m   RA Area:     19.70 cm LA Vol (A2C):   68.2 ml 37.30 ml/m  RA Volume:   53.90 ml  29.48 ml/m LA Vol (A4C):   42.3 ml 23.14 ml/m LA Biplane Vol: 53.5 ml 29.26 ml/m  AORTIC VALVE LVOT Vmax:   117.00 cm/s LVOT Vmean:  82.500 cm/s LVOT VTI:    0.216 m  AORTA Ao Root diam: 3.00 cm MITRAL VALVE MV Area (PHT): 2.62 cm    SHUNTS MV Decel Time: 289 msec    Systemic VTI:  0.22 m MV E velocity: 53.10 cm/s  Systemic Diam: 2.20 cm MV A velocity: 66.80 cm/s MV E/A ratio:  0.79 Sheryle Donning MD Electronically signed by Sheryle Donning MD Signature Date/Time: 11/09/2023/4:21:24 PM    Final    CT Angio Chest PE W/Cm &/Or Wo Cm Result Date: 11/09/2023 CLINICAL DATA:  Pulmonary embolism EXAM: CT ANGIOGRAPHY CHEST WITH CONTRAST TECHNIQUE: Multidetector CT imaging of the chest was performed using the standard protocol during bolus administration of intravenous contrast. Multiplanar CT image reconstructions and MIPs were obtained to evaluate the vascular anatomy. RADIATION DOSE REDUCTION: This exam was performed according to  the departmental dose-optimization program which includes automated exposure control, adjustment of the mA and/or kV according to patient size and/or use of iterative reconstruction technique. CONTRAST:  75mL OMNIPAQUE IOHEXOL 350 MG/ML SOLN COMPARISON:  None Available. FINDINGS: Cardiovascular: Satisfactory opacification of the pulmonary arteries to the segmental level. No evidence of pulmonary embolism. Normal heart size. No pericardial effusion. Extensive coronary artery calcifications Mediastinum/Nodes: Large left thyroid  mass measures 5.1 x 5.2 x 6.6 cm in the AP, transverse and longitudinal diameter deviating trachea to the right of the midline, findings  correlate with large thyroid  goiter. No other mediastinal masses. 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 No acute cardiopulmonary infiltrates or dominant suspicious pulmonary nodules Upper Abdomen: Several hepatic cysts. No other significant findings. Musculoskeletal: No chest wall abnormality. No acute or significant osseous findings. Review of the MIP images confirms the above findings. IMPRESSION: *No evidence of pulmonary embolism. 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 *Large left thyroid  goiter. *Several hepatic cysts. Electronically Signed   By: Fredrich Jefferson M.D.   On: 11/09/2023 15:32    Procedures Procedures    Medications Ordered in ED Medications  iohexol (OMNIPAQUE) 350 MG/ML injection 75 mL (75 mLs Intravenous Contrast Given 11/09/23 1519)    ED Course/ Medical Decision Making/ A&P                                 Medical Decision Making  Patient presents for assessment since abnormal echo outpatient.  Echo report reviewed and previous physician placed report in the chart concern for possible right atrial thrombus.  Fortunately patient well-appearing no signs of significant heart failure or heart attack based on symptoms and signs.  Vital signs normal in the room.  Blood work independently reviewed mild anemia 12, normal white count electrolytes unremarkable, troponins negative.  CT angiogram no blood clot, goiter seen.  Ultrasound/echo results independently reviewed and discussed with cardiology and there is no signs of acute findings no signs of thrombus.  Patient stable for discharge and follow-up with his primary doctor.        Final Clinical Impression(s) / ED Diagnoses Final diagnoses:  Abnormal echocardiogram    Rx / DC Orders ED Discharge Orders     None          Clay Cummins, MD 11/09/23 1734

## 2023-11-09 NOTE — ED Notes (Addendum)
 Patient transported to CT

## 2023-11-09 NOTE — ED Notes (Signed)
 Patient discharged by RN. Patient verbalizes understanding of instructions. Ambulatory to lobby.

## 2023-11-09 NOTE — Progress Notes (Signed)
 Echocardiogram 2D Echocardiogram has been performed.  Emmaline Haring Jimmi Sidener RDCS 11/09/2023, 4:05 PM

## 2023-11-09 NOTE — ED Triage Notes (Signed)
 Pt here from home with c/o abnormal echo done today , pt states that he feels ok besides a cough

## 2023-11-09 NOTE — Discharge Instructions (Signed)
 Follow up with your primary doctor as needed

## 2023-12-01 ENCOUNTER — Other Ambulatory Visit: Payer: Self-pay | Admitting: Family Medicine

## 2023-12-01 DIAGNOSIS — E042 Nontoxic multinodular goiter: Secondary | ICD-10-CM

## 2023-12-02 ENCOUNTER — Ambulatory Visit
Admission: RE | Admit: 2023-12-02 | Discharge: 2023-12-02 | Discharge status: Home / Self Care | Source: Ambulatory Visit | Attending: Family Medicine | Admitting: Family Medicine

## 2023-12-02 DIAGNOSIS — E042 Nontoxic multinodular goiter: Secondary | ICD-10-CM

## 2024-01-11 ENCOUNTER — Ambulatory Visit: Admitting: Podiatry
